# Patient Record
Sex: Male | Born: 1985 | Race: White | Hispanic: No | Marital: Single | Smoking: Never smoker
Health system: Southern US, Community
[De-identification: ages and names within clinical notes are randomized; demographics above are authoritative.]

---
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
## 2007-03-30 ENCOUNTER — Emergency Department: Payer: Self-pay | Admitting: Emergency Medicine

## 2008-03-08 ENCOUNTER — Ambulatory Visit: Payer: Self-pay | Admitting: Gastroenterology

## 2009-06-14 ENCOUNTER — Emergency Department: Payer: Self-pay | Admitting: Internal Medicine

## 2010-01-06 ENCOUNTER — Emergency Department: Payer: Self-pay | Admitting: Emergency Medicine

## 2010-04-18 ENCOUNTER — Emergency Department: Payer: Self-pay | Admitting: Emergency Medicine

## 2013-09-04 ENCOUNTER — Emergency Department: Payer: Self-pay | Admitting: Emergency Medicine

## 2013-09-04 LAB — URINALYSIS, COMPLETE
Bacteria: NONE SEEN
Ketone: NEGATIVE
Nitrite: NEGATIVE
Protein: NEGATIVE
Specific Gravity: 1.042 (ref 1.003–1.030)
Squamous Epithelial: NONE SEEN
WBC UR: <1 /HPF (ref 0–5)

## 2013-09-04 LAB — COMPREHENSIVE METABOLIC PANEL
Anion Gap: 7 (ref 7–16)
BUN: 13 mg/dL (ref 7–18)
Bilirubin,Total: 0.8 mg/dL (ref 0.2–1.0)
Calcium, Total: 9.2 mg/dL (ref 8.5–10.1)
Co2: 26 mmol/L (ref 21–32)
Creatinine: 0.9 mg/dL (ref 0.60–1.30)
EGFR (African American): >60
EGFR (Non-African Amer.): >60
Potassium: 3.8 mmol/L (ref 3.5–5.1)
SGOT(AST): 13 U/L — ABNORMAL LOW (ref 15–37)
SGPT (ALT): 25 U/L (ref 12–78)

## 2013-09-04 LAB — CBC
HCT: 48.8 % (ref 40.0–52.0)
HGB: 16.5 g/dL (ref 13.0–18.0)
MCH: 28.7 pg (ref 26.0–34.0)
MCV: 85 fL (ref 80–100)
Platelet: 177 10*3/uL (ref 150–440)
RBC: 5.73 10*6/uL (ref 4.40–5.90)
RDW: 13.1 % (ref 11.5–14.5)
WBC: 14.5 10*3/uL — ABNORMAL HIGH (ref 3.8–10.6)

## 2013-09-04 LAB — LIPASE, BLOOD: Lipase: 103 U/L (ref 73–393)

## 2014-09-08 ENCOUNTER — Ambulatory Visit: Payer: Self-pay | Admitting: Emergency Medicine

## 2014-09-08 LAB — RAPID STREP-A WITH REFLX: Micro Text Report: NEGATIVE

## 2014-09-11 LAB — BETA STREP CULTURE(ARMC)

## 2014-11-21 ENCOUNTER — Emergency Department: Payer: Self-pay | Admitting: Physician Assistant

## 2015-02-25 ENCOUNTER — Ambulatory Visit
Admission: EM | Admit: 2015-02-25 | Discharge: 2015-02-25 | Disposition: A | Discharge status: Home / Self Care | Payer: 59 | Attending: Family Medicine | Admitting: Family Medicine

## 2015-02-25 ENCOUNTER — Encounter: Payer: Self-pay | Admitting: Family Medicine

## 2015-02-25 ENCOUNTER — Ambulatory Visit: Payer: 59

## 2015-02-25 DIAGNOSIS — S39012A Strain of muscle, fascia and tendon of lower back, initial encounter: Secondary | ICD-10-CM | POA: Diagnosis not present

## 2015-02-25 DIAGNOSIS — R319 Hematuria, unspecified: Secondary | ICD-10-CM | POA: Diagnosis not present

## 2015-02-25 DIAGNOSIS — X58XXXA Exposure to other specified factors, initial encounter: Secondary | ICD-10-CM | POA: Insufficient documentation

## 2015-02-25 DIAGNOSIS — M549 Dorsalgia, unspecified: Secondary | ICD-10-CM | POA: Diagnosis present

## 2015-02-25 LAB — URINALYSIS COMPLETE WITH MICROSCOPIC (ARMC ONLY)
BACTERIA UA: NONE SEEN — AB
BILIRUBIN URINE: NEGATIVE
GLUCOSE, UA: NEGATIVE mg/dL
Ketones, ur: NEGATIVE mg/dL
Leukocytes, UA: NEGATIVE
Nitrite: NEGATIVE
PROTEIN: NEGATIVE mg/dL
SPECIFIC GRAVITY, URINE: 1.025 (ref 1.005–1.030)
Squamous Epithelial / LPF: NONE SEEN — AB
pH: 6.5 (ref 5.0–8.0)

## 2015-02-25 MED ORDER — MELOXICAM 15 MG PO TABS: 15.0000 mg | ORAL_TABLET | Freq: Every day | ORAL | Status: AC

## 2015-02-25 MED ORDER — KETOROLAC TROMETHAMINE 60 MG/2ML IM SOLN
60.0000 mg | Freq: Once | INTRAMUSCULAR | Status: AC
  Administered 2015-02-25: 60 mg via INTRAMUSCULAR

## 2015-02-25 MED ORDER — METAXALONE 800 MG PO TABS: 800.0000 mg | ORAL_TABLET | Freq: Three times a day (TID) | ORAL | Status: AC

## 2015-02-25 NOTE — ED Provider Notes (Addendum)
CSN: [id number]     Arrival date & time 02/25/15  1010 History   First MD Initiated Contact with Patient 02/25/15 1226     Chief Complaint  Patient presents with  . 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  Was lifting heavy items at [employer] earlier this am shift started 0400 left work to come here.  Denied dysuria, hematuria, fever, chills, vomiting, diarrhea, saddle paresthesias, leg weakness, loss of bowel/bladder control.  No position is comfortable.    Patient is a 29 y.o. male presenting with back pain. The history is provided by the patient and a significant other.  Back Pain Location:  Lumbar spine Quality:  Stabbing and cramping Radiates to:  Does not radiate Pain severity:  Severe Pain is:  Same all the time Onset quality:  Sudden Duration:  1 day Timing:  Intermittent Progression:  Unchanged Chronicity:  New Context: lifting heavy objects and twisting   Context: not emotional stress, not falling, not jumping from heights, not MCA, not MVA, not occupational injury, not pedestrian accident, not physical stress, not recent illness and not recent injury   Relieved by:  Nothing Worsened by:  Twisting Ineffective treatments:  Being still Associated symptoms: no abdominal pain, no abdominal swelling, no bladder incontinence, no bowel incontinence, no chest pain, no dysuria, no fever, no headaches, no leg pain, no numbness, no paresthesias, no pelvic pain, no perianal numbness, no tingling, no weakness and no weight loss   Risk factors: obesity   Risk factors: no hx of cancer, no hx of osteoporosis, no lack of exercise, no menopause, not pregnant, no recent surgery, no steroid use and no vascular disease     History reviewed. No pertinent past  medical history. History reviewed. No pertinent past surgical history. Family History  Problem Relation Age of Onset  . Heart failure Mother   . Heart failure Father   . Diabetes Father    History  Substance Use Topics  . Smoking status: Never Smoker   . Smokeless tobacco: Not on file  . Alcohol Use: Yes     Comment: occasional    Review of Systems  Constitutional: Negative for fever and weight loss.  Cardiovascular: Negative for chest pain.  Gastrointestinal: Negative for abdominal pain and bowel incontinence.  Genitourinary: Negative for bladder incontinence, dysuria and pelvic pain.  Musculoskeletal: Positive for back pain.  Neurological: Negative for tingling, weakness, numbness, headaches and paresthesias.    Allergies  Review of patient's allergies indicates no known allergies.  Home Medications   Prior to Admission medications   Medication Sig Start Date End Date Taking? Authorizing Provider  meloxicam (MOBIC) 15 MG tablet Take 1 tablet (15 mg total) by mouth daily. 02/25/15   Hassan Rowan, MD  metaxalone (SKELAXIN) 800 MG tablet Take 1 tablet (800 mg total) by mouth 3 (three) times daily. 02/25/15   Hassan Rowan, MD   BP 116/87 mmHg  Pulse 75  Temp(Src) 98.5 F (36.9 C) (Oral)  Resp 16  Ht  (1.88 m)  Wt 268 lb (121.564 kg)  BMI 34.39 kg/m2  SpO2 99% Physical Exam  Constitutional: He is oriented to person, place, and time. Vital signs are normal. He appears well-developed and well-nourished. No distress.  HENT:  Head: Normocephalic and atraumatic.  Right Ear: External ear normal.  Left  Ear: External ear normal.  Nose: Nose normal.  Mouth/Throat: Oropharynx is clear and moist.  Eyes: Conjunctivae, EOM and lids are normal. Pupils are equal, round, and reactive to light. Right eye exhibits no discharge. No scleral icterus.  Neck: Trachea normal and normal range of motion. Neck supple. No JVD present. No tracheal deviation present.  Cardiovascular: Normal  rate, regular rhythm, normal heart sounds and intact distal pulses.  Exam reveals no gallop and no friction rub.   No murmur heard. Pulmonary/Chest: Effort normal and breath sounds normal. No respiratory distress. He has no wheezes. He has no rales. He exhibits no tenderness.  Abdominal: Soft. Normal appearance and bowel sounds are normal. He exhibits no distension, no fluid wave, no ascites, no pulsatile midline mass and no mass. There is no tenderness. There is no rebound, no guarding and no CVA tenderness.  Musculoskeletal: He exhibits tenderness. He exhibits no edema.       Lumbar back: He exhibits decreased range of motion, tenderness and pain. He exhibits no bony tenderness, no swelling, no edema, no deformity, no laceration, no spasm and normal pulse.       Arms: Lymphadenopathy:    He has no cervical adenopathy.  Neurological: He is alert and oriented to person, place, and time. He has normal reflexes. He displays normal reflexes. No cranial nerve deficit. Coordination normal.  Skin: Skin is warm, dry and intact. No rash noted. He is not diaphoretic. No erythema. No pallor.  Psychiatric: He has a normal mood and affect. His speech is normal and behavior is normal. Judgment and thought content normal. Cognition and memory are normal.  Nursing note and vitals reviewed.  1245 Has taken muscle relaxants in past but cannot remember which one.  Trial toradol  IM x 1 now.  Will repeat BP after 15 minutes.  Urine sample obtained and urinalysis pending consider CT/imaging if blood in urine.  ED Course  Procedures (including critical care time) Labs Review Labs Reviewed  URINALYSIS COMPLETEWITH MICROSCOPIC ([facility] ONLY) - Abnormal; Notable for the following:    Hgb urine dipstick TRACE (*)    Bacteria, UA NONE SEEN (*)    Squamous Epithelial / LPF NONE SEEN (*)    All other components within normal limits  URINE CULTURE    Imaging Review Ct Abdomen Pelvis Wo Contrast  02/25/2015    CLINICAL DATA:  Right-sided flank pain and hematuria  EXAM: CT ABDOMEN AND PELVIS WITHOUT CONTRAST  TECHNIQUE: Multidetector CT imaging of the abdomen and pelvis was performed following the standard protocol without IV contrast.  COMPARISON:  09/04/2013  FINDINGS: Lung bases are free of acute infiltrate or sizable effusion.  The liver, gallbladder, spleen, adrenal glands and pancreas are all normal in their CT appearance. The kidneys are well visualized bilaterally. No renal calculi are seen. No obstructive changes are noted. The bladder is well distended. No pelvic mass lesion is seen. The appendix is not well visualized although no inflammatory changes are noted. The osseous structures are within normal limits.  IMPRESSION: No acute abnormality noted.   Electronically Signed   By: Alcide Clever M.D.   On: 02/25/2015 15:49    1315 still awaiting urinalysis results.  Patient reported some improvement with back pain after toradol able to do some rotation and lateral bending to right now without worsening pain.  Patient eating in exam room significant other brought him lunch. 1340 patient notified urinalysis results with trace hemoglobin/blood microscopic discussed CT scan stone protocol versus KUB and patient  prefers to proceed with CT if insurance authorization can be obtained.  Pain with flexion from lying to sitting but mobile on own, comfortable, nausea resolved awaiting CT scan. MDM   1. Lumbar strain, initial encounter   2. Hematuria    1600  Work excuse x 48 hours due to skelaxin use. For acute pain, rest, and intermittent application of heat (do not sleep on heating pad).  I discussed longer-term treatment plan of PRN PO NSAIDS and I discussed a home back care exercise program with a strengthening and flexibility exercise.  Patient given Exitcare handout on low back strain.  48 hour restriction no lifting greater than 25 lbs.  Proper avoidance of heavy lifting discussed.  Consider physical therapy or  chiropractic care and radiology if not improving e.g as most back pain resolves within 4 weeks.  Call or return to clinic as needed if these symptoms worsen or fail to improve as anticipated especially leg weakness, loss of bowel/bladder control, erections or saddle paresthesias.   Patient verbalized understanding of instructions/information and agreed with plan of care. P2:  Injury Prevention, fitness   Patient did not want any antinausea medication.  Resting comfortably in exam room.  Discussed CT results and urinalysis results with patient and given copies of reports.  Hydrate, hydrate.  Call or return to clinic as needed if these symptoms worsen or fail to improve as anticipated e.g. gross hematuria, fever, worsening pain, unable to void every 8 hours.  Exitcare handout on hematuria given to patient.  Follow up with PCM next week for repeat urinalysis.  Urine culture results pending over next 48 hours.  Patient verbalized agreement and understanding of treatment plan and had no further questions at this time. P2:  Hydrate, post coital urination    Barbaraann Barthelina A Malloree Raboin, NP 02/25/15 1620  Telephone message left for patient urine culture contaminated multiple organisms, requesting symptom status update and verification he received message and to contact clinic to verify message receipt and if symptom resolution.  01 Mar 2015 at 0952  Barbaraann Barthelina A Aslin Farinas, NP 03/01/15 [phone]  Patient returned call notified urine culture contaminated.  Patient requested work excuse for 27 Feb 2015 as still having back pain and taking medications as prescribed.  Work excusal for 27 Feb 2015 left at clinic front desk for patient.   Patient to follow up for re-evaluation if symptoms worsening/not improving as anticipated.   Patient verbalized understanding of information and had no further questions at this time.  Barbaraann Barthelina A Lawrance Wiedemann, NP 03/01/15 1109

## 2015-02-25 NOTE — ED Notes (Signed)
Patient states he woke up this morning nauseous, then there was pain in lower back . The pain has gotten worse as the morning went on. He can't get comfortable.

## 2015-02-25 NOTE — Discharge Instructions (Signed)
Hematuria Hematuria is blood in your urine. It can be caused by a bladder infection, kidney infection, prostate infection, kidney stone, or cancer of your urinary tract. Infections can usually be treated with medicine, and a kidney stone usually will pass through your urine. If neither of these is the cause of your hematuria, further workup to find out the reason may be needed. It is very important that you tell your health care provider about any blood you see in your urine, even if the blood stops without treatment or happens without causing pain. Blood in your urine that happens and then stops and then happens again can be a symptom of a very serious condition. Also, pain is not a symptom in the initial stages of many urinary cancers. HOME CARE INSTRUCTIONS   Drink lots of fluid, 3-4 quarts a day. If you have been diagnosed with an infection, cranberry juice is especially recommended, in addition to large amounts of water.  Avoid caffeine, tea, and carbonated beverages because they tend to irritate the bladder.  Avoid alcohol because it may irritate the prostate.  Take all medicines as directed by your health care provider.  If you were prescribed an antibiotic medicine, finish it all even if you start to feel better.  If you have been diagnosed with a kidney stone, follow your health care provider's instructions regarding straining your urine to catch the stone.  Empty your bladder often. Avoid holding urine for long periods of time.  After a bowel movement, women should cleanse front to back. Use each tissue only once.  Empty your bladder before and after sexual intercourse if you are a male. SEEK MEDICAL CARE IF:  You develop back pain.  You have a fever.  You have a feeling of sickness in your stomach (nausea) or vomiting.  Your symptoms are not better in 3 days. Return sooner if you are getting worse. SEEK IMMEDIATE MEDICAL CARE IF:   You develop severe vomiting and are  unable to keep the medicine down.  You develop severe back or abdominal pain despite taking your medicines.  You begin passing a large amount of blood or clots in your urine.  You feel extremely weak or faint, or you pass out. MAKE SURE YOU:   Understand these instructions.  Will watch your condition.  Will get help right away if you are not doing well or get worse. Document Released: 09/17/2005 Document Revised: 02/01/2014 Document Reviewed: 05/18/2013 [facility] Patient Information 2015 [place], [place]. This information is not intended to replace advice given to you by your health care provider. Make sure you discuss any questions you have with your health care provider. Low Back Sprain with Rehab  A sprain is an injury in which a ligament is torn. The ligaments of the lower back are vulnerable to sprains. However, they are strong and require great force to be injured. These ligaments are important for stabilizing the spinal column. Sprains are classified into three categories. Grade 1 sprains cause pain, but the tendon is not lengthened. Grade 2 sprains include a lengthened ligament, due to the ligament being stretched or partially ruptured. With grade 2 sprains there is still function, although the function may be decreased. Grade 3 sprains involve a complete tear of the tendon or muscle, and function is usually impaired. SYMPTOMS   Severe pain in the lower back.  Sometimes, a feeling of a "pop," "snap," or tear, at the time of injury.  Tenderness and sometimes swelling at the injury site.  Uncommonly, bruising (contusion) within 48 hours of injury.  Muscle spasms in the back. CAUSES  Low back sprains occur when a force is placed on the ligaments that is greater than they can handle. Common causes of injury include:  Performing a stressful act while off-balance.  Repetitive stressful activities that involve movement of the lower back.  Direct hit (trauma) to the lower  back. RISK INCREASES WITH:  Contact sports (football, wrestling).  Collisions (major skiing accidents).  Sports that require throwing or lifting (baseball, weightlifting).  Sports involving twisting of the spine (gymnastics, diving, tennis, golf).  Poor strength and flexibility.  Inadequate protection.  Previous back injury or surgery (especially fusion). PREVENTION  Wear properly fitted and padded protective equipment.  Warm up and stretch properly before activity.  Allow for adequate recovery between workouts.  Maintain physical fitness:  Strength, flexibility, and endurance.  Cardiovascular fitness.  Maintain a healthy body weight. PROGNOSIS  If treated properly, low back sprains usually heal with non-surgical treatment. The length of time for healing depends on the severity of the injury.  RELATED COMPLICATIONS   Recurring symptoms, resulting in a chronic problem.  Chronic inflammation and pain in the low back.  Delayed healing or resolution of symptoms, especially if activity is resumed too soon.  Prolonged impairment.  Unstable or arthritic joints of the low back. TREATMENT  Treatment first involves the use of ice and medicine, to reduce pain and inflammation. The use of strengthening and stretching exercises may help reduce pain with activity. These exercises may be performed at home or with a therapist. Severe injuries may require referral to a therapist for further evaluation and treatment, such as ultrasound. Your caregiver may advise that you wear a back brace or corset, to help reduce pain and discomfort. Often, prolonged bed rest results in greater harm then benefit. Corticosteroid injections may be recommended. However, these should be reserved for the most serious cases. It is important to avoid using your back when lifting objects. At night, sleep on your back on a firm mattress, with a pillow placed under your knees. If non-surgical treatment is  unsuccessful, surgery may be needed.  MEDICATION   If pain medicine is needed, nonsteroidal anti-inflammatory medicines (aspirin and ibuprofen), or other minor pain relievers (acetaminophen), are often advised.  Do not take pain medicine for 7 days before surgery.  Prescription pain relievers may be given, if your caregiver thinks they are needed. Use only as directed and only as much as you need.  Ointments applied to the skin may be helpful.  Corticosteroid injections may be given by your caregiver. These injections should be reserved for the most serious cases, because they may only be given a certain number of times. HEAT AND COLD  Cold treatment (icing) should be applied for 10 to 15 minutes every 2 to 3 hours for inflammation and pain, and immediately after activity that aggravates your symptoms. Use ice packs or an ice massage.  Heat treatment may be used before performing stretching and strengthening activities prescribed by your caregiver, physical therapist, or athletic trainer. Use a heat pack or a warm water soak. SEEK MEDICAL CARE IF:   Symptoms get worse or do not improve in 2 to 4 weeks, despite treatment.  You develop numbness or weakness in either leg.  You lose bowel or bladder function.  Any of the following occur after surgery: fever, increased pain, swelling, redness, drainage of fluids, or bleeding in the affected area.  New, unexplained symptoms develop. (Drugs  used in treatment may produce side effects.) EXERCISES  RANGE OF MOTION (ROM) AND STRETCHING EXERCISES - Low Back Sprain Most people with lower back pain will find that their symptoms get worse with excessive bending forward (flexion) or arching at the lower back (extension). The exercises that will help resolve your symptoms will focus on the opposite motion.  Your physician, physical therapist or athletic trainer will help you determine which exercises will be most helpful to resolve your lower back  pain. Do not complete any exercises without first consulting with your caregiver. Discontinue any exercises which make your symptoms worse, until you speak to your caregiver. If you have pain, numbness or tingling which travels down into your buttocks, leg or foot, the goal of the therapy is for these symptoms to move closer to your back and eventually resolve. Sometimes, these leg symptoms will get better, but your lower back pain may worsen. This is often an indication of progress in your rehabilitation. Be very alert to any changes in your symptoms and the activities in which you participated in the 24 hours prior to the change. Sharing this information with your caregiver will allow him or her to most efficiently treat your condition. These exercises may help you when beginning to rehabilitate your injury. Your symptoms may resolve with or without further involvement from your physician, physical therapist or athletic trainer. While completing these exercises, remember:   Restoring tissue flexibility helps normal motion to return to the joints. This allows healthier, less painful movement and activity.  An effective stretch should be held for at least 30 seconds.  A stretch should never be painful. You should only feel a gentle lengthening or release in the stretched tissue. FLEXION RANGE OF MOTION AND STRETCHING EXERCISES: STRETCH - Flexion, Single Knee to Chest   Lie on a firm bed or floor with both legs extended in front of you.  Keeping one leg in contact with the floor, bring your opposite knee to your chest. Hold your leg in place by either grabbing behind your thigh or at your knee.  Pull until you feel a gentle stretch in your low back. Hold __________ seconds.  Slowly release your grasp and repeat the exercise with the opposite side. Repeat __________ times. Complete this exercise __________ times per day.  STRETCH - Flexion, Double Knee to Chest  Lie on a firm bed or floor with  both legs extended in front of you.  Keeping one leg in contact with the floor, bring your opposite knee to your chest.  Tense your stomach muscles to support your back and then lift your other knee to your chest. Hold your legs in place by either grabbing behind your thighs or at your knees.  Pull both knees toward your chest until you feel a gentle stretch in your low back. Hold __________ seconds.  Tense your stomach muscles and slowly return one leg at a time to the floor. Repeat __________ times. Complete this exercise __________ times per day.  STRETCH - Low Trunk Rotation  Lie on a firm bed or floor. Keeping your legs in front of you, bend your knees so they are both pointed toward the ceiling and your feet are flat on the floor.  Extend your arms out to the side. This will stabilize your upper body by keeping your shoulders in contact with the floor.  Gently and slowly drop both knees together to one side until you feel a gentle stretch in your low back. Hold for  __________ seconds.  Tense your stomach muscles to support your lower back as you bring your knees back to the starting position. Repeat the exercise to the other side. Repeat __________ times. Complete this exercise __________ times per day  EXTENSION RANGE OF MOTION AND FLEXIBILITY EXERCISES: STRETCH - Extension, Prone on Elbows   Lie on your stomach on the floor, a bed will be too soft. Place your palms about shoulder width apart and at the height of your head.  Place your elbows under your shoulders. If this is too painful, stack pillows under your chest.  Allow your body to relax so that your hips drop lower and make contact more completely with the floor.  Hold this position for __________ seconds.  Slowly return to lying flat on the floor. Repeat __________ times. Complete this exercise __________ times per day.  RANGE OF MOTION - Extension, Prone Press Ups  Lie on your stomach on the floor, a bed will be  too soft. Place your palms about shoulder width apart and at the height of your head.  Keeping your back as relaxed as possible, slowly straighten your elbows while keeping your hips on the floor. You may adjust the placement of your hands to maximize your comfort. As you gain motion, your hands will come more underneath your shoulders.  Hold this position __________ seconds.  Slowly return to lying flat on the floor. Repeat __________ times. Complete this exercise __________ times per day.  RANGE OF MOTION- Quadruped, Neutral Spine   Assume a hands and knees position on a firm surface. Keep your hands under your shoulders and your knees under your hips. You may place padding under your knees for comfort.  Drop your head and point your tailbone toward the ground below you. This will round out your lower back like an angry cat. Hold this position for __________ seconds.  Slowly lift your head and release your tail bone so that your back sags into a large arch, like an old horse.  Hold this position for __________ seconds.  Repeat this until you feel limber in your low back.  Now, find your "sweet spot." This will be the most comfortable position somewhere between the two previous positions. This is your neutral spine. Once you have found this position, tense your stomach muscles to support your low back.  Hold this position for __________ seconds. Repeat __________ times. Complete this exercise __________ times per day.  STRENGTHENING EXERCISES - Low Back Sprain These exercises may help you when beginning to rehabilitate your injury. These exercises should be done near your "sweet spot." This is the neutral, low-back arch, somewhere between fully rounded and fully arched, that is your least painful position. When performed in this safe range of motion, these exercises can be used for people who have either a flexion or extension based injury. These exercises may resolve your symptoms with or  without further involvement from your physician, physical therapist or athletic trainer. While completing these exercises, remember:   Muscles can gain both the endurance and the strength needed for everyday activities through controlled exercises.  Complete these exercises as instructed by your physician, physical therapist or athletic trainer. Increase the resistance and repetitions only as guided.  You may experience muscle soreness or fatigue, but the pain or discomfort you are trying to eliminate should never worsen during these exercises. If this pain does worsen, stop and make certain you are following the directions exactly. If the pain is still present after adjustments,  discontinue the exercise until you can discuss the trouble with your caregiver. STRENGTHENING - Deep Abdominals, Pelvic Tilt   Lie on a firm bed or floor. Keeping your legs in front of you, bend your knees so they are both pointed toward the ceiling and your feet are flat on the floor.  Tense your lower abdominal muscles to press your low back into the floor. This motion will rotate your pelvis so that your tail bone is scooping upwards rather than pointing at your feet or into the floor. With a gentle tension and even breathing, hold this position for __________ seconds. Repeat __________ times. Complete this exercise __________ times per day.  STRENGTHENING - Abdominals, Crunches   Lie on a firm bed or floor. Keeping your legs in front of you, bend your knees so they are both pointed toward the ceiling and your feet are flat on the floor. Cross your arms over your chest.  Slightly tip your chin down without bending your neck.  Tense your abdominals and slowly lift your trunk high enough to just clear your shoulder blades. Lifting higher can put excessive stress on the lower back and does not further strengthen your abdominal muscles.  Control your return to the starting position. Repeat __________ times. Complete  this exercise __________ times per day.  STRENGTHENING - Quadruped, Opposite UE/LE Lift   Assume a hands and knees position on a firm surface. Keep your hands under your shoulders and your knees under your hips. You may place padding under your knees for comfort.  Find your neutral spine and gently tense your abdominal muscles so that you can maintain this position. Your shoulders and hips should form a rectangle that is parallel with the floor and is not twisted.  Keeping your trunk steady, lift your right hand no higher than your shoulder and then your left leg no higher than your hip. Make sure you are not holding your breath. Hold this position for __________ seconds.  Continuing to keep your abdominal muscles tense and your back steady, slowly return to your starting position. Repeat with the opposite arm and leg. Repeat __________ times. Complete this exercise __________ times per day.  STRENGTHENING - Abdominals and Quadriceps, Straight Leg Raise   Lie on a firm bed or floor with both legs extended in front of you.  Keeping one leg in contact with the floor, bend the other knee so that your foot can rest flat on the floor.  Find your neutral spine, and tense your abdominal muscles to maintain your spinal position throughout the exercise.  Slowly lift your straight leg off the floor about 6 inches for a count of 15, making sure to not hold your breath.  Still keeping your neutral spine, slowly lower your leg all the way to the floor. Repeat this exercise with each leg __________ times. Complete this exercise __________ times per day. POSTURE AND BODY MECHANICS CONSIDERATIONS - Low Back Sprain Keeping correct posture when sitting, standing or completing your activities will reduce the stress put on different body tissues, allowing injured tissues a chance to heal and limiting painful experiences. The following are general guidelines for improved posture. Your physician or physical  therapist will provide you with any instructions specific to your needs. While reading these guidelines, remember:  The exercises prescribed by your provider will help you have the flexibility and strength to maintain correct postures.  The correct posture provides the best environment for your joints to work. All of your joints have  less wear and tear when properly supported by a spine with good posture. This means you will experience a healthier, less painful body.  Correct posture must be practiced with all of your activities, especially prolonged sitting and standing. Correct posture is as important when doing repetitive low-stress activities (typing) as it is when doing a single heavy-load activity (lifting). RESTING POSITIONS Consider which positions are most painful for you when choosing a resting position. If you have pain with flexion-based activities (sitting, bending, stooping, squatting), choose a position that allows you to rest in a less flexed posture. You would want to avoid curling into a fetal position on your side. If your pain worsens with extension-based activities (prolonged standing, working overhead), avoid resting in an extended position such as sleeping on your stomach. Most people will find more comfort when they rest with their spine in a more neutral position, neither too rounded nor too arched. Lying on a non-sagging bed on your side with a pillow between your knees, or on your back with a pillow under your knees will often provide some relief. Keep in mind, being in any one position for a prolonged period of time, no matter how correct your posture, can still lead to stiffness. PROPER SITTING POSTURE In order to minimize stress and discomfort on your spine, you must sit with correct posture. Sitting with good posture should be effortless for a healthy body. Returning to good posture is a gradual process. Many people can work toward this most comfortably by using various  supports until they have the flexibility and strength to maintain this posture on their own. When sitting with proper posture, your ears will fall over your shoulders and your shoulders will fall over your hips. You should use the back of the chair to support your upper back. Your lower back will be in a neutral position, just slightly arched. You may place a small pillow or folded towel at the base of your lower back for  support.  When working at a desk, create an environment that supports good, upright posture. Without extra support, muscles tire, which leads to excessive strain on joints and other tissues. Keep these recommendations in mind: CHAIR:  A chair should be able to slide under your desk when your back makes contact with the back of the chair. This allows you to work closely.  The chair's height should allow your eyes to be level with the upper part of your monitor and your hands to be slightly lower than your elbows. BODY POSITION  Your feet should make contact with the floor. If this is not possible, use a foot rest.  Keep your ears over your shoulders. This will reduce stress on your neck and low back. INCORRECT SITTING POSTURES  If you are feeling tired and unable to assume a healthy sitting posture, do not slouch or slump. This puts excessive strain on your back tissues, causing more damage and pain. Healthier options include:  Using more support, like a lumbar pillow.  Switching tasks to something that requires you to be upright or walking.  Talking a brief walk.  Lying down to rest in a neutral-spine position. PROLONGED STANDING WHILE SLIGHTLY LEANING FORWARD  When completing a task that requires you to lean forward while standing in one place for a long time, place either foot up on a stationary 2-4 inch high object to help maintain the best posture. When both feet are on the ground, the lower back tends to lose its slight  inward curve. If this curve flattens (or becomes  too large), then the back and your other joints will experience too much stress, tire more quickly, and can cause pain. CORRECT STANDING POSTURES Proper standing posture should be assumed with all daily activities, even if they only take a few moments, like when brushing your teeth. As in sitting, your ears should fall over your shoulders and your shoulders should fall over your hips. You should keep a slight tension in your abdominal muscles to brace your spine. Your tailbone should point down to the ground, not behind your body, resulting in an over-extended swayback posture.  INCORRECT STANDING POSTURES  Common incorrect standing postures include a forward head, locked knees and/or an excessive swayback. WALKING Walk with an upright posture. Your ears, shoulders and hips should all line-up. PROLONGED ACTIVITY IN A FLEXED POSITION When completing a task that requires you to bend forward at your waist or lean over a low surface, try to find a way to stabilize 3 out of 4 of your limbs. You can place a hand or elbow on your thigh or rest a knee on the surface you are reaching across. This will provide you more stability, so that your muscles do not tire as quickly. By keeping your knees relaxed, or slightly bent, you will also reduce stress across your lower back. CORRECT LIFTING TECHNIQUES DO :  Assume a wide stance. This will provide you more stability and the opportunity to get as close as possible to the object which you are lifting.  Tense your abdominals to brace your spine. Bend at the knees and hips. Keeping your back locked in a neutral-spine position, lift using your leg muscles. Lift with your legs, keeping your back straight.  Test the weight of unknown objects before attempting to lift them.  Try to keep your elbows locked down at your sides in order get the best strength from your shoulders when carrying an object.  Always ask for help when lifting heavy or awkward  objects. INCORRECT LIFTING TECHNIQUES DO NOT:   Lock your knees when lifting, even if it is a small object.  Bend and twist. Pivot at your feet or move your feet when needing to change directions.  Assume that you can safely pick up even a paperclip without proper posture. Document Released: 09/17/2005 Document Revised: 12/10/2011 Document Reviewed: 12/30/2008 [facility] [place] Patient Information 2015 [place], [place]. This information is not intended to replace advice given to you by your health care provider. Make sure you discuss any questions you have with your health care provider.

## 2015-02-27 LAB — URINE CULTURE

## 2016-04-04 IMAGING — CT CT ABD-PELV W/O CM
2 of 4 series · 17 of 46 positions shown, 19 images · non-contrast
Comparison: 09/04/2013

CLINICAL DATA: Right-sided flank pain and hematuria

EXAM:
CT ABDOMEN AND PELVIS WITHOUT CONTRAST
TECHNIQUE: Multidetector CT imaging of the abdomen and pelvis was performed
following the standard protocol without IV contrast.

[Series 2: soft tissue · axial · 0.75mm/px · z∈[-964,-494]mm · 14 of 104 slices shown, 16 images]
[im 5/104  soft-tissue]
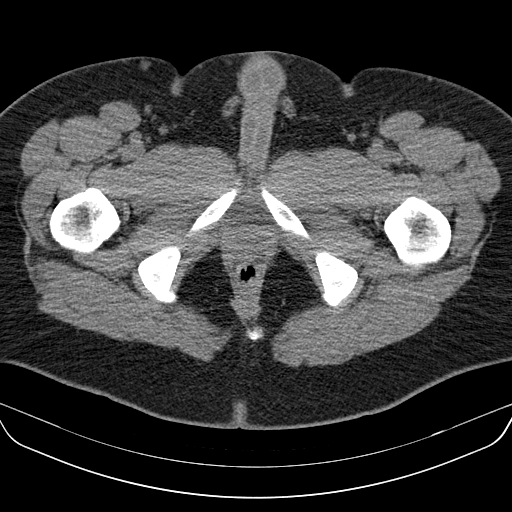
[im 5/104  bone]
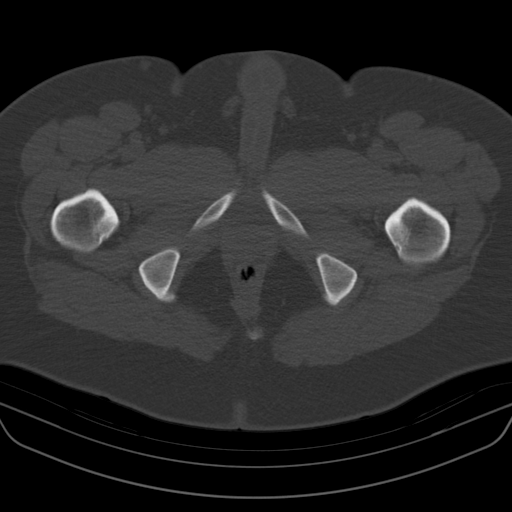
[im 13/104  soft-tissue]
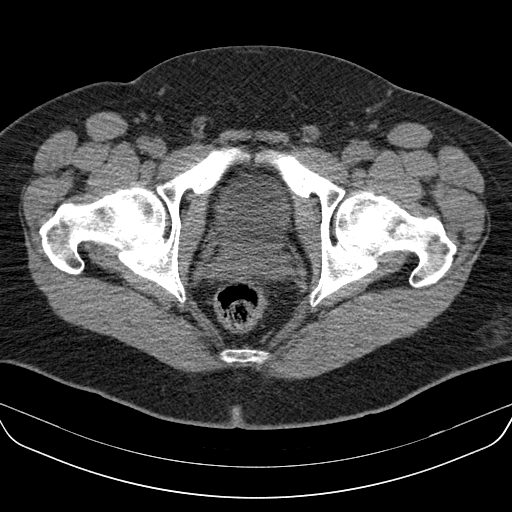
[im 21/104  soft-tissue]
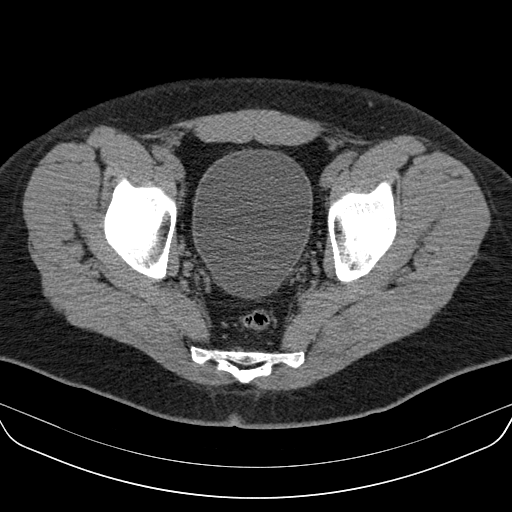
[im 29/104  soft-tissue]
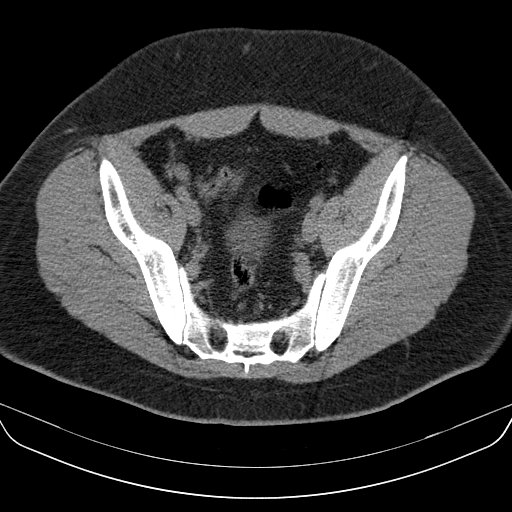
[im 33/104  soft-tissue]
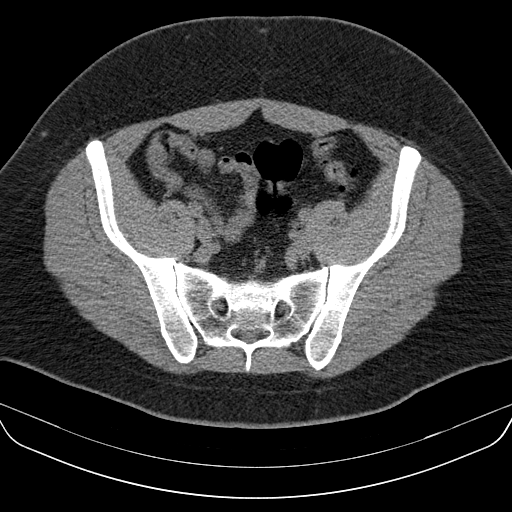
[im 42/104  soft-tissue]
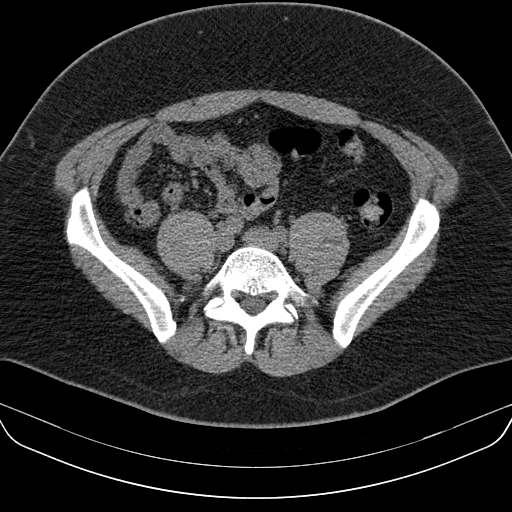
[im 50/104  soft-tissue]
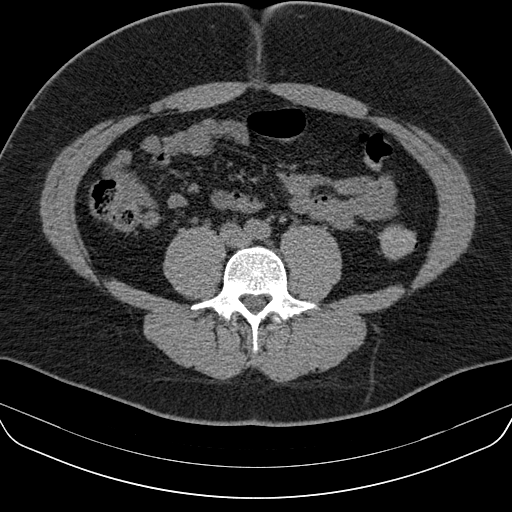
[im 54/104  soft-tissue]
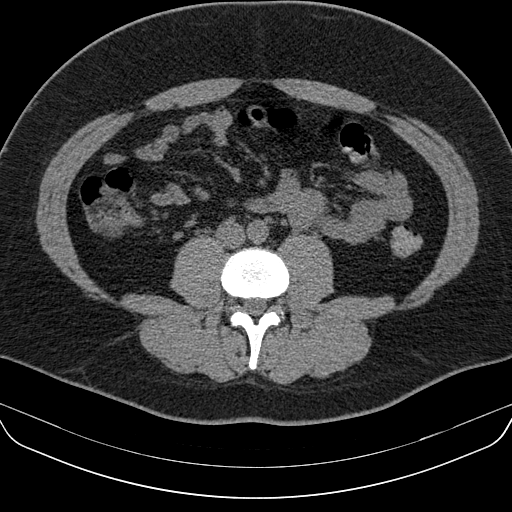
[im 62/104  soft-tissue]
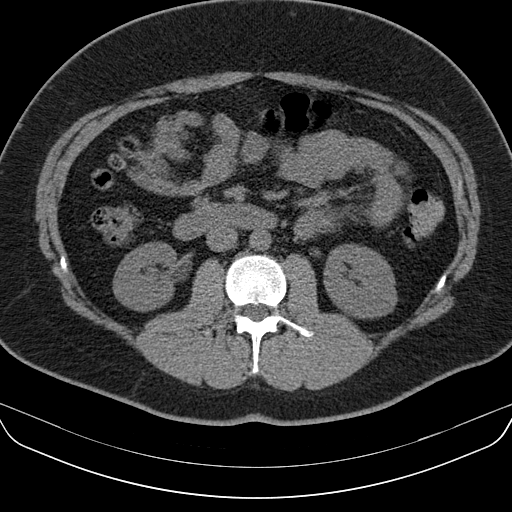
[im 62/104  bone]
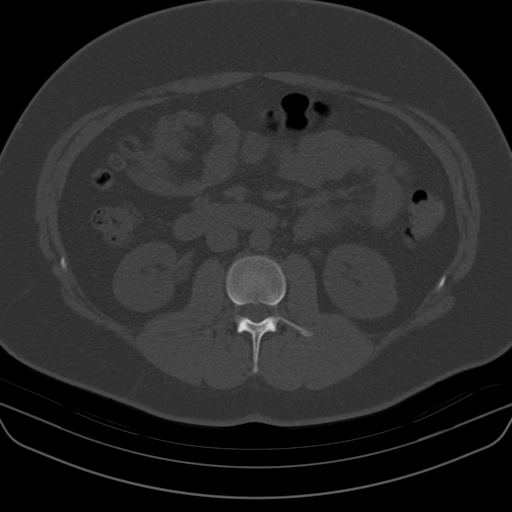
[im 71/104  soft-tissue]
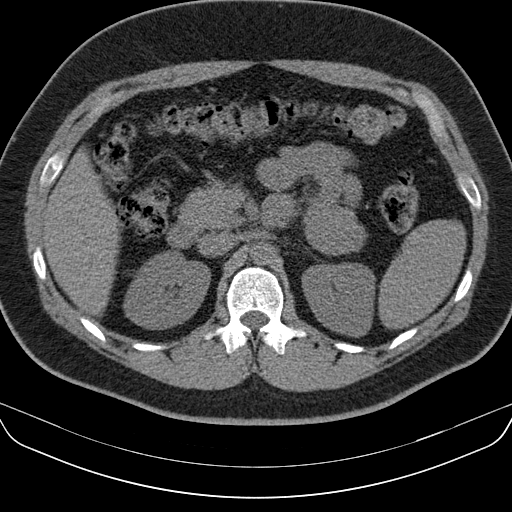
[im 79/104  soft-tissue]
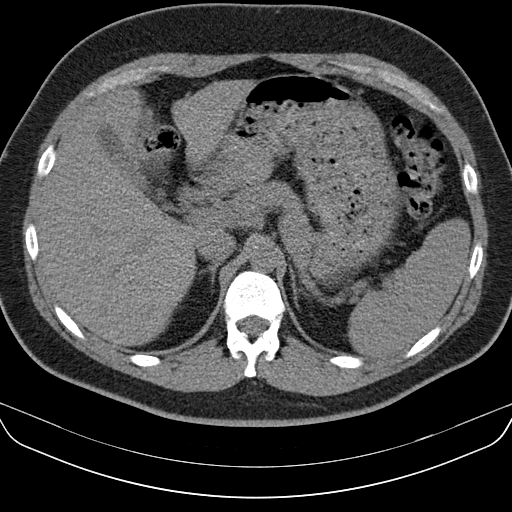
[im 83/104  soft-tissue]
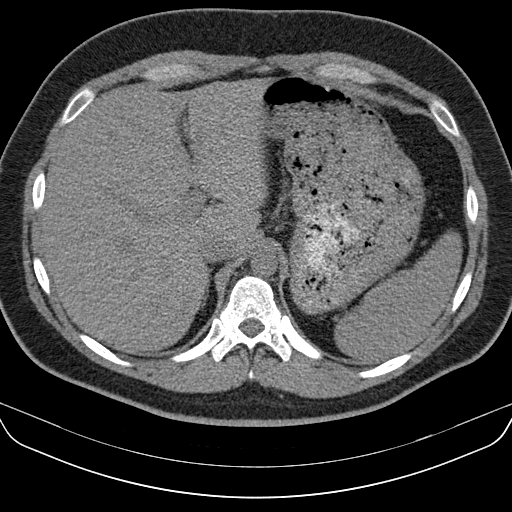
[im 91/104  soft-tissue]
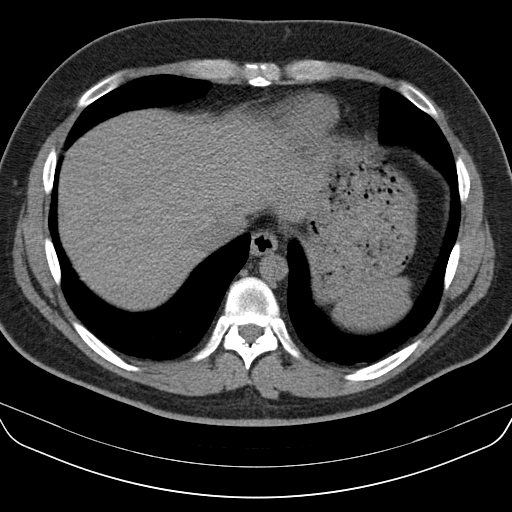
[im 99/104  soft-tissue]
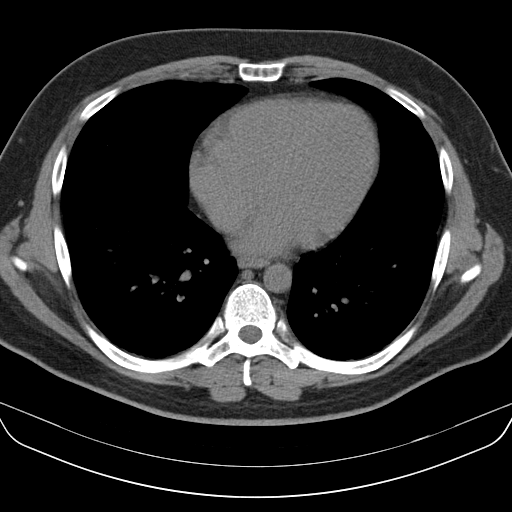

[Series 602: coronal · coronal · 1.03mm/px · 3 of 115 slices shown]
[im 39/115  soft-tissue]
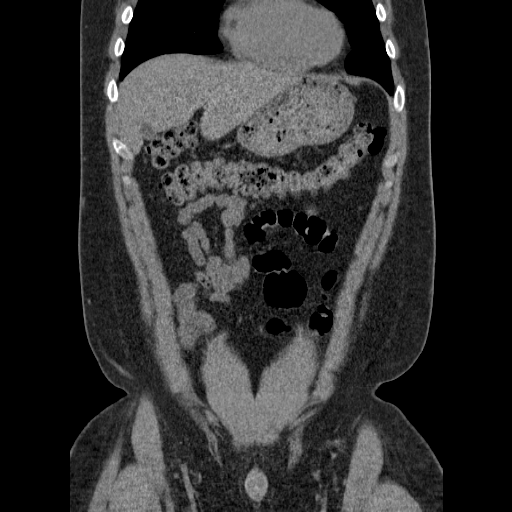
[im 51/115  soft-tissue]
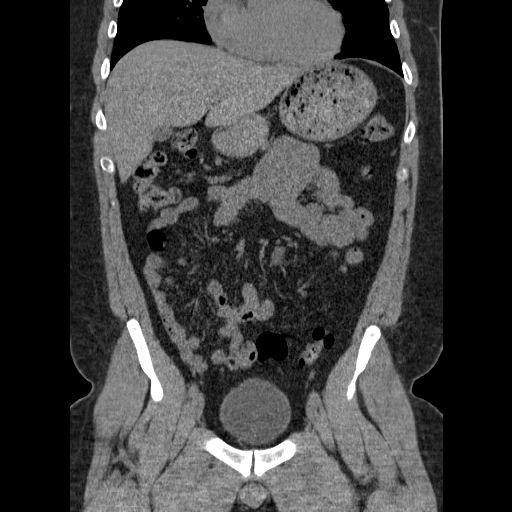
[im 64/115  soft-tissue]
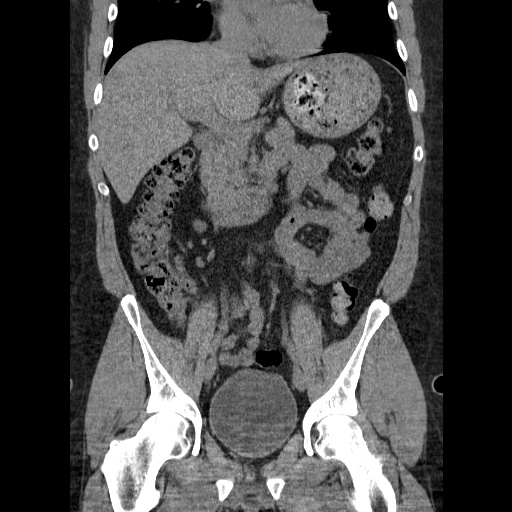

[17 of 46 positions shown; findings below may reference images not displayed]

FINDINGS: Lung bases are free of acute infiltrate or sizable effusion.

The liver, gallbladder, spleen, adrenal glands and pancreas are all
normal in their CT appearance. The kidneys are well visualized
bilaterally. No renal calculi are seen. No obstructive changes are
noted. The bladder is well distended. No pelvic mass lesion is seen.
The appendix is not well visualized although no inflammatory changes
are noted. The osseous structures are within normal limits.
IMPRESSION: No acute abnormality noted.
